# Patient Record
Sex: Male | Born: 1990
Health system: Southern US, Community
[De-identification: ages and names within clinical notes are randomized; demographics above are authoritative.]

## PROBLEM LIST (undated history)

## (undated) DIAGNOSIS — K219 Gastro-esophageal reflux disease without esophagitis: Secondary | ICD-10-CM

## (undated) HISTORY — DX: Gastro-esophageal reflux disease without esophagitis: K21.9

## (undated) HISTORY — PX: ELBOW SURGERY: SHX618

---
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
## 2013-08-19 ENCOUNTER — Emergency Department (HOSPITAL_BASED_OUTPATIENT_CLINIC_OR_DEPARTMENT_OTHER): Payer: BC Managed Care – PPO

## 2013-08-19 ENCOUNTER — Encounter (HOSPITAL_BASED_OUTPATIENT_CLINIC_OR_DEPARTMENT_OTHER): Payer: Self-pay | Admitting: *Deleted

## 2013-08-19 ENCOUNTER — Emergency Department (HOSPITAL_BASED_OUTPATIENT_CLINIC_OR_DEPARTMENT_OTHER)
Admission: EM | Admit: 2013-08-19 | Discharge: 2013-08-19 | Disposition: A | Discharge status: Home / Self Care | Payer: BC Managed Care – PPO | Attending: Emergency Medicine | Admitting: Emergency Medicine

## 2013-08-19 DIAGNOSIS — R21 Rash and other nonspecific skin eruption: Secondary | ICD-10-CM | POA: Insufficient documentation

## 2013-08-19 DIAGNOSIS — S60112A Contusion of left thumb with damage to nail, initial encounter: Secondary | ICD-10-CM

## 2013-08-19 DIAGNOSIS — S6000XA Contusion of unspecified finger without damage to nail, initial encounter: Secondary | ICD-10-CM | POA: Insufficient documentation

## 2013-08-19 DIAGNOSIS — W230XXA Caught, crushed, jammed, or pinched between moving objects, initial encounter: Secondary | ICD-10-CM | POA: Insufficient documentation

## 2013-08-19 DIAGNOSIS — Y9289 Other specified places as the place of occurrence of the external cause: Secondary | ICD-10-CM | POA: Insufficient documentation

## 2013-08-19 DIAGNOSIS — T148XXA Other injury of unspecified body region, initial encounter: Secondary | ICD-10-CM

## 2013-08-19 DIAGNOSIS — Y990 Civilian activity done for income or pay: Secondary | ICD-10-CM | POA: Insufficient documentation

## 2013-08-19 DIAGNOSIS — Y9389 Activity, other specified: Secondary | ICD-10-CM | POA: Insufficient documentation

## 2013-08-19 MED ORDER — IBUPROFEN 600 MG PO TABS: 600.0000 mg | ORAL_TABLET | Freq: Four times a day (QID) | ORAL | Status: DC | PRN

## 2013-08-19 MED ORDER — HYDROCODONE-ACETAMINOPHEN 5-325 MG PO TABS: 5.0000 | ORAL_TABLET | Freq: Four times a day (QID) | ORAL | Status: DC | PRN

## 2013-08-19 MED ORDER — ACETAMINOPHEN 325 MG PO TABS
650.0000 mg | ORAL_TABLET | Freq: Once | ORAL | Status: AC
  Administered 2013-08-19: 650 mg via ORAL
  Filled 2013-08-19: qty 2

## 2013-08-19 MED ORDER — HYDROCODONE-ACETAMINOPHEN 5-325 MG PO TABS: 1.0000 | ORAL_TABLET | Freq: Four times a day (QID) | ORAL | Status: DC | PRN

## 2013-08-19 NOTE — ED Provider Notes (Addendum)
CSN: [id number]     Arrival date & time 08/19/13  0844 History   First MD Initiated Contact with Patient 08/19/13 [phone]     Chief Complaint  Patient presents with  . Hand Pain   (Consider location/radiation/quality/duration/timing/severity/associated sxs/prior Treatment) HPI Comments: Pt comes in with cc of thumb pain. Pt has no medical problems, works as a Systems analyst, and jammed his left thumb between [employer] y'day. He states that the injry occurred at 4, and overtime, his pain has increased significantly. The pain is throbbing, constant, severe.  Patient is a 22 y.o. male presenting with hand pain. The history is provided by the patient.  Hand Pain    History reviewed. No pertinent past medical history. History reviewed. No pertinent past surgical history. History reviewed. No pertinent family history. History  Substance Use Topics  . Smoking status: Never Smoker   . Smokeless tobacco: Not on file  . Alcohol Use: No    Review of Systems  Constitutional: Positive for activity change.  Musculoskeletal: Positive for arthralgias.  Skin: Positive for rash.  Hematological: Does not bruise/bleed easily.    Allergies  Review of patient's allergies indicates no known allergies.  Home Medications   Current Outpatient Rx  Name  Route  Sig  Dispense  Refill  . HYDROcodone-acetaminophen (NORCO/VICODIN) 5-325 MG per tablet   Oral   Take 5 tablets by mouth every 6 (six) hours as needed for pain.   15 tablet   0   . ibuprofen (ADVIL,MOTRIN) 600 MG tablet   Oral   Take 1 tablet (600 mg total) by mouth every 6 (six) hours as needed for pain.   30 tablet   0    BP 163/79  Pulse 60  Temp(Src) 98.1 F (36.7 C) (Oral)  Resp 16  Ht 5\' 11"  (1.803 m)  Wt 200 lb (90.719 kg)  BMI 27.91 kg/m2  SpO2 99% Physical Exam  Nursing note and vitals reviewed. Constitutional: He appears well-developed.  HENT:  Head: Normocephalic.  Eyes: Conjunctivae are normal.  Neck: Neck  supple.  Pulmonary/Chest: Effort normal.  Musculoskeletal:  Left thumb is slightly swollen, with subungal hematoma. Tenderness mostly distal to the IP joint    ED Course  Procedures (including critical care time) Labs Review Labs Reviewed - No data to display Imaging Review Dg Finger Thumb Right  08/19/2013   *RADIOLOGY REPORT*  Clinical Data: Pain post trauma  RIGHT THUMB 2+V  Comparison: None.  Findings: Frontal, oblique, and lateral views were obtained.  There is no fracture or dislocation.  Joint spaces appear intact.  No erosive change.  IMPRESSION: No abnormality noted.   Original Report Authenticated By: Bretta Bang, M.D.    MDM   1. Contusion   2. Hematoma, subungual, thumb, left, initial encounter      Subungal Hematoma Drainage: Date/Time: 08/19/2013 10:15 AM Performed by: Derwood Kaplan Authorized by: Derwood Kaplan Consent: Verbal consent obtained. Equipment used: Cauterizing nail trephinator Risks and benefits: risks, benefits and alternatives were discussed Consent given by: patient Patient identity confirmed: verbally with patient Local anesthesia used: no Patient sedated: no Patient tolerance: Patient tolerated the procedure well with no immediate complications.  Pt comes in with thumb injury. Xrays is normal. Will provide thumb spica. Likely contusion.  Pt also had subungal hematoma - likely cause of the burning type pain. Hematoma drained. DC with Sports f/u.   Derwood Kaplan, MD 08/19/13 1017  Derwood Kaplan, MD 09/04/13 [phone]

## 2013-08-19 NOTE — ED Notes (Signed)
Jammed right thumb under a barbell last pm at 1800 nail bed blue and purple joint below nail is swollen and painful.

## 2013-08-19 NOTE — Discharge Instructions (Signed)
Return to the Er if the symptoms are getting worse. Otherwise, ice aggressively, take the pain meds prescribed.  See the doctor as requested.  RICE: Routine Care for Injuries The routine care of many injuries includes Rest, Ice, Compression, and Elevation (RICE). HOME CARE INSTRUCTIONS  Rest is needed to allow your body to heal. Routine activities can usually be resumed when comfortable. Injured tendons and bones can take up to 6 weeks to heal. Tendons are the cord-like structures that attach muscle to bone.  Ice following an injury helps keep the swelling down and reduces pain.  Put ice in a plastic bag.  Place a towel between your skin and the bag.  Leave the ice on for 15-20 minutes, 3-4 times a day. Do this while awake, for the first 24 to 48 hours. After that, continue as directed by your caregiver.  Compression helps keep swelling down. It also gives support and helps with discomfort. If an elastic bandage has been applied, it should be removed and reapplied every 3 to 4 hours. It should not be applied tightly, but firmly enough to keep swelling down. Watch fingers or toes for swelling, bluish discoloration, coldness, numbness, or excessive pain. If any of these problems occur, remove the bandage and reapply loosely. Contact your caregiver if these problems continue.  Elevation helps reduce swelling and decreases pain. With extremities, such as the arms, hands, legs, and feet, the injured area should be placed near or above the level of the heart, if possible. SEEK IMMEDIATE MEDICAL CARE IF:  You have persistent pain and swelling.  You develop redness, numbness, or unexpected weakness.  Your symptoms are getting worse rather than improving after several days. These symptoms may indicate that further evaluation or further X-rays are needed. Sometimes, X-rays may not show a small broken bone (fracture) until 1 week or 10 days later. Make a follow-up appointment with your caregiver.  Ask when your X-ray results will be ready. Make sure you get your X-ray results. Document Released: 03/16/2001 Document Revised: 02/24/2012 Document Reviewed: 05/03/2011 [facility] Patient Information 2014 [place], [place].   Contusion A contusion is a deep bruise. Contusions are the result of an injury that caused bleeding under the skin. The contusion may turn blue, purple, or yellow. Minor injuries will give you a painless contusion, but more severe contusions may stay painful and swollen for a few weeks.  CAUSES  A contusion is usually caused by a blow, trauma, or direct force to an area of the body. SYMPTOMS   Swelling and redness of the injured area.  Bruising of the injured area.  Tenderness and soreness of the injured area.  Pain. DIAGNOSIS  The diagnosis can be made by taking a history and physical exam. An X-ray, CT scan, or MRI may be needed to determine if there were any associated injuries, such as fractures. TREATMENT  Specific treatment will depend on what area of the body was injured. In general, the best treatment for a contusion is resting, icing, elevating, and applying cold compresses to the injured area. Over-the-counter medicines may also be recommended for pain control. Ask your caregiver what the best treatment is for your contusion. HOME CARE INSTRUCTIONS   Put ice on the injured area.  Put ice in a plastic bag.  Place a towel between your skin and the bag.  Leave the ice on for 15-20 minutes, 3-4 times a day.  Only take over-the-counter or prescription medicines for pain, discomfort, or fever as directed by your caregiver.  Your caregiver may recommend avoiding anti-inflammatory medicines (aspirin, ibuprofen, and naproxen) for 48 hours because these medicines may increase bruising.  Rest the injured area.  If possible, elevate the injured area to reduce swelling. SEEK IMMEDIATE MEDICAL CARE IF:   You have increased bruising or swelling.  You have pain  that is getting worse.  Your swelling or pain is not relieved with medicines. MAKE SURE YOU:   Understand these instructions.  Will watch your condition.  Will get help right away if you are not doing well or get worse. Document Released: 09/11/2005 Document Revised: 02/24/2012 Document Reviewed: 10/07/2011 [facility] Patient Information 2014 [place], [place].  Nail Bed Injury The nail bed is the soft tissue under the nail. This tissue includes the growth center of the nail. If this growth center is damaged, the nail may not grow back normally. It can take several months for an injured or torn off (avulsed) fingernail or toenail to regrow. The regrown nail might have an abnormal shape or appearance.  DIAGNOSIS   Tell your caregiver exactly how your injury occurred.  Tell your caregiver if you think there is a splinter (wood, metal, glass) in your finger or toe or under the nail.  Tell your caregiver about any other medical problems you have (especially diabetes or peripheral vascular disease). Tell them also about any medications you take.  In addition to examining your injury, your caregiver may need to check for diabetes, nerve problems or poor circulation.  Nail bed injuries can involve the bone in the tip of your finger or toe. X-rays may be needed to see if you have a fracture. TREATMENT  Your nail bed injury may be treated in several different ways.  You may not require any special treatment other than keeping the area clean and free of infection.  You may require removal of a blood clot under the nail. This can often be a very simple procedure.  You may need part of your nail removed. This might be necessary to suture any cut (laceration) in the nail bed. Sometimes the avulsed nail is stitched back in place to provide temporary protection to the nail bed until the new nail grows in.  For certain injuries, your caregiver may direct you to see a hand specialist. HOME CARE  INSTRUCTIONS   Keep your hand or foot elevated to relieve pain and swelling.  For your foot, this will require lying in bed or on a couch with the leg on pillows or sitting in a recliner with the leg up. Walking or letting your leg dangle may increase swelling, slow healing and cause throbbing pain.  For your hand, this will require elevating your hand above the level of your heart. Use pillows on a table or the arm of your chair while sitting, and on your bed while sleeping.  Keep your injury protected with bandages or splints as recommended.  Keep your bandage dry and clean. Change your bandage as directed by your caregiver.  Only take over-the-counter or prescription medicines for pain, discomfort or fever as directed by your caregiver.  See your caregiver as needed for problems. SEEK MEDICAL CARE IF:   You have pain that is not controlled by your medication.  You have any problems caring for your injury. SEEK IMMEDIATE MEDICAL CARE IF:   You have increased pain, swelling, inflammation (redness & warmth), drainage or bleeding.  An oral temperature above 102 F (38.9 C) develops, not controlled by medication.  You have swelling which spreads  from your finger into your hand, or your toe into your foot. Document Released: 01/09/2005 Document Revised: 02/24/2012 Document Reviewed: 12/06/2008 [facility] Patient Information 2014 [place], [place].

## 2018-02-27 ENCOUNTER — Emergency Department (HOSPITAL_BASED_OUTPATIENT_CLINIC_OR_DEPARTMENT_OTHER)
Admission: EM | Admit: 2018-02-27 | Discharge: 2018-02-27 | Disposition: A | Discharge status: Home / Self Care | Payer: PRIVATE HEALTH INSURANCE | Attending: Emergency Medicine | Admitting: Emergency Medicine

## 2018-02-27 ENCOUNTER — Other Ambulatory Visit: Payer: Self-pay

## 2018-02-27 ENCOUNTER — Encounter (HOSPITAL_BASED_OUTPATIENT_CLINIC_OR_DEPARTMENT_OTHER): Payer: Self-pay | Admitting: Emergency Medicine

## 2018-02-27 DIAGNOSIS — Y999 Unspecified external cause status: Secondary | ICD-10-CM | POA: Insufficient documentation

## 2018-02-27 DIAGNOSIS — Y93G1 Activity, food preparation and clean up: Secondary | ICD-10-CM | POA: Diagnosis not present

## 2018-02-27 DIAGNOSIS — Y929 Unspecified place or not applicable: Secondary | ICD-10-CM | POA: Insufficient documentation

## 2018-02-27 DIAGNOSIS — S6992XA Unspecified injury of left wrist, hand and finger(s), initial encounter: Secondary | ICD-10-CM | POA: Diagnosis present

## 2018-02-27 DIAGNOSIS — W260XXA Contact with knife, initial encounter: Secondary | ICD-10-CM | POA: Diagnosis not present

## 2018-02-27 DIAGNOSIS — S61012A Laceration without foreign body of left thumb without damage to nail, initial encounter: Secondary | ICD-10-CM | POA: Insufficient documentation

## 2018-02-27 DIAGNOSIS — Z23 Encounter for immunization: Secondary | ICD-10-CM | POA: Diagnosis not present

## 2018-02-27 MED ORDER — TETANUS-DIPHTH-ACELL PERTUSSIS 5-2.5-18.5 LF-MCG/0.5 IM SUSP
0.5000 mL | Freq: Once | INTRAMUSCULAR | Status: AC
  Administered 2018-02-27: 0.5 mL via INTRAMUSCULAR
  Filled 2018-02-27: qty 0.5

## 2018-02-27 MED ORDER — CEPHALEXIN 250 MG PO CAPS
500.0000 mg | ORAL_CAPSULE | Freq: Once | ORAL | Status: AC
  Administered 2018-02-27: 500 mg via ORAL
  Filled 2018-02-27: qty 2

## 2018-02-27 MED ORDER — CEPHALEXIN 500 MG PO CAPS: 500.0000 mg | ORAL_CAPSULE | Freq: Four times a day (QID) | ORAL | 0 refills | Status: AC

## 2018-02-27 MED FILL — CEPHALEXIN 500 MG CAPSULE: 500 | 5 days supply | Qty: 20 | Fill #0

## 2018-02-27 NOTE — ED Notes (Signed)
ED Provider at bedside. 

## 2018-02-27 NOTE — ED Triage Notes (Signed)
Patient states that he cut the tip of his left thumb about 30 - 40 minutes ago

## 2018-02-27 NOTE — ED Provider Notes (Signed)
[facility] EMERGENCY DEPARTMENT Provider Note   CSN: [id number] Arrival date & time: 02/27/18  1149     History   Chief Complaint Chief Complaint  Patient presents with  . Extremity Laceration    HPI Isaac Bailey is a 27 y.o. male.  27 year old male presents to the emergency department for evaluation of injury to the tip of his left thumb.  Patient states that he was cutting meat when he accidentally sliced the tip of his left first digit.  Incident occurred 30-40 minutes ago.  He applied pressure after the injury which has largely controlled the bleeding.  Patient does note a mild, constant pain which is aggravated with palpation to the area. He denies any sensation changes or range of motion limitations to the affected digit.  He cannot recall the date of his last tetanus shot. No medications taken PTA for pain.    The history is provided by the patient. No language interpreter was used.    History reviewed. No pertinent past medical history.  There are no active problems to display for this patient.   History reviewed. No pertinent surgical history.     Home Medications    Prior to Admission medications   Medication Sig Start Date End Date Taking? Authorizing Provider  cephALEXin (KEFLEX) 500 MG capsule Take 1 capsule (500 mg total) by mouth 4 (four) times daily for 5 days. 02/27/18 03/04/18  Antony MaduraHumes, Keilany Burnette, PA-C  HYDROcodone-acetaminophen (NORCO/VICODIN) 5-325 MG per tablet Take 1 tablet by mouth every 6 (six) hours as needed for pain. 08/19/13   Derwood KaplanNanavati, Ankit, MD  ibuprofen (ADVIL,MOTRIN) 600 MG tablet Take 1 tablet (600 mg total) by mouth every 6 (six) hours as needed for pain. 08/19/13   Derwood KaplanNanavati, Ankit, MD    Family History History reviewed. No pertinent family history.  Social History Social History   Tobacco Use  . Smoking status: Never Smoker  . Smokeless tobacco: Never Used  Substance Use Topics  . Alcohol use: No  . Drug use: No      Allergies   Patient has no known allergies.   Review of Systems Review of Systems Ten systems reviewed and are negative for acute change, except as noted in the HPI.    Physical Exam Updated Vital Signs BP (!) 145/87 (BP Location: Right Arm)   Pulse 84   Temp 98 F (36.7 C) (Oral)   Resp 20   Ht 5\' 11"  (1.803 m)   Wt 90.7 kg (200 lb)   SpO2 98%   BMI 27.89 kg/m   Physical Exam  Constitutional: He is oriented to person, place, and time. He appears well-developed and well-nourished. No distress.  Nontoxic appearing and in NAD  HENT:  Head: Normocephalic and atraumatic.  Eyes: Conjunctivae and EOM are normal. No scleral icterus.  Neck: Normal range of motion.  Cardiovascular: Normal rate, regular rhythm and intact distal pulses.  Capillary refill brisk to the tip of the affected digit.  Pulmonary/Chest: Effort normal. No respiratory distress.  Respirations even and unlabored  Musculoskeletal: Normal range of motion.  Near avulsion to the tip of the distal L thumb. No active bleeding.   Neurological: He is alert and oriented to person, place, and time.  Sensation intact to the distal tip of the L thumb. Normal ROM of the affected digit.  Skin: Skin is warm and dry. No rash noted. He is not diaphoretic. No erythema. No pallor.  Psychiatric: He has a normal mood and affect. His behavior  is normal.  Nursing note and vitals reviewed.    ED Treatments / Results  Labs (all labs ordered are listed, but only abnormal results are displayed) Labs Reviewed - No data to display  EKG  EKG Interpretation None       Radiology No results found.  Procedures Procedures (including critical care time)  LACERATION REPAIR Performed by: Antony Madura Authorized by: Antony Madura Consent: Verbal consent obtained. Risks and benefits: risks, benefits and alternatives were discussed Consent given by: patient Patient identity confirmed: provided demographic data Prepped and  Draped in normal sterile fashion Wound explored  Laceration Location: L thumb  Laceration Length: 1cm  No Foreign Bodies seen or palpated  Irrigation method: syringe Amount of cleaning: standard  Skin closure: Dermabond  Number of sutures: n/a  Technique: simple  Patient tolerance: Patient tolerated the procedure well with no immediate complications.   Medications Ordered in ED Medications  Tdap (BOOSTRIX) injection 0.5 mL (not administered)  cephALEXin (KEFLEX) capsule 500 mg (not administered)     Initial Impression / Assessment and Plan / ED Course  I have reviewed the triage vital signs and the nursing notes.  Pertinent labs & imaging results that were available during my care of the patient were reviewed by me and considered in my medical decision making (see chart for details).     Tdap booster given. Laceration occurred < 8 hours prior to repair which was well tolerated. Pt has no comorbidities to effect normal wound healing. Discussed wound home care with patient and answered questions. Pt to follow up for wound recheck PRN. Pt is hemodynamically stable with no complaints prior to discharge.     Final Clinical Impressions(s) / ED Diagnoses   Final diagnoses:  Laceration of left thumb without foreign body, nail damage status unspecified, initial encounter    ED Discharge Orders        Ordered    cephALEXin (KEFLEX) 500 MG capsule  4 times daily     02/27/18 1210       Antony Madura, PA-C 02/27/18 1217    Terrilee Files, MD 02/28/18 1940

## 2018-02-27 NOTE — Discharge Instructions (Signed)
Take Keflex as prescribed until finished. You may take tylenol or ibuprofen for pain. Keep the area covered to prevent repeat injury. Return to the ED if symptoms worsen or signs of infection develop.

## 2019-03-18 DIAGNOSIS — S8012XA Contusion of left lower leg, initial encounter: Secondary | ICD-10-CM | POA: Diagnosis not present

## 2019-03-18 DIAGNOSIS — S91002A Unspecified open wound, left ankle, initial encounter: Secondary | ICD-10-CM | POA: Diagnosis not present

## 2019-03-22 ENCOUNTER — Other Ambulatory Visit: Payer: Self-pay

## 2019-03-22 ENCOUNTER — Ambulatory Visit (INDEPENDENT_AMBULATORY_CARE_PROVIDER_SITE_OTHER): Payer: BLUE CROSS/BLUE SHIELD | Admitting: Family Medicine

## 2019-03-22 ENCOUNTER — Encounter: Payer: Self-pay | Admitting: Family Medicine

## 2019-03-22 VITALS — BP 125/86 | HR 67 | Temp 97.9°F | Ht 71.0 in | Wt 200.0 lb

## 2019-03-22 DIAGNOSIS — S8992XA Unspecified injury of left lower leg, initial encounter: Secondary | ICD-10-CM

## 2019-03-22 DIAGNOSIS — M25512 Pain in left shoulder: Secondary | ICD-10-CM | POA: Diagnosis not present

## 2019-03-22 DIAGNOSIS — G8929 Other chronic pain: Secondary | ICD-10-CM | POA: Diagnosis not present

## 2019-03-22 NOTE — Patient Instructions (Signed)
You have rotator cuff impingement Try to avoid painful activities (overhead activities, lifting with extended arm) as much as possible. Ibuprofen OR diclofenac with food as needed for pain and inflammation. Can take tylenol in addition to this. Consider physical therapy with transition to home exercise program. Do home exercise program with theraband and scapular stabilization exercises daily 3 sets of 10 once a day. If not improving at follow-up we will consider further imaging, injection, physical therapy, and/or nitro patches. Let me know how you're doing over the next 4-6 weeks - would consider adding nitro patches as next step if not improving as expected.  Your lower leg injury is a contusion with hematoma but improving as expected. Elevate above your heart when possible. Icing 15 minutes at a time 3-4 times a day. Consider ACE wrap for compression for swelling. Follow up as needed - expect this to feel back to normal within 2 weeks.
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
## 2019-09-14 ENCOUNTER — Telehealth: Payer: Self-pay

## 2019-09-14 ENCOUNTER — Encounter (HOSPITAL_BASED_OUTPATIENT_CLINIC_OR_DEPARTMENT_OTHER): Payer: Self-pay | Admitting: Emergency Medicine

## 2019-09-14 ENCOUNTER — Other Ambulatory Visit: Payer: Self-pay

## 2019-09-14 ENCOUNTER — Emergency Department (HOSPITAL_BASED_OUTPATIENT_CLINIC_OR_DEPARTMENT_OTHER)
Admission: EM | Admit: 2019-09-14 | Discharge: 2019-09-14 | Disposition: A | Discharge status: Home / Self Care | Payer: PRIVATE HEALTH INSURANCE | Attending: Emergency Medicine | Admitting: Emergency Medicine

## 2019-09-14 DIAGNOSIS — R309 Painful micturition, unspecified: Secondary | ICD-10-CM | POA: Insufficient documentation

## 2019-09-14 DIAGNOSIS — Z202 Contact with and (suspected) exposure to infections with a predominantly sexual mode of transmission: Secondary | ICD-10-CM | POA: Insufficient documentation

## 2019-09-14 LAB — URINALYSIS, ROUTINE W REFLEX MICROSCOPIC
Bilirubin Urine: NEGATIVE
Glucose, UA: NEGATIVE mg/dL
Hgb urine dipstick: NEGATIVE
Ketones, ur: NEGATIVE mg/dL
Leukocytes,Ua: NEGATIVE
Nitrite: NEGATIVE
Protein, ur: NEGATIVE mg/dL
Specific Gravity, Urine: 1.02 (ref 1.005–1.030)
pH: 6.5 (ref 5.0–8.0)

## 2019-09-14 MED ORDER — AZITHROMYCIN 1 G PO PACK
1.0000 g | PACK | Freq: Once | ORAL | Status: AC
  Administered 2019-09-14: 14:00:00 1 g via ORAL
  Filled 2019-09-14: qty 1

## 2019-09-14 MED ORDER — CEFTRIAXONE SODIUM 250 MG IJ SOLR
250.0000 mg | Freq: Once | INTRAMUSCULAR | Status: AC
  Administered 2019-09-14: 14:00:00 250 mg via INTRAMUSCULAR
  Filled 2019-09-14: qty 250

## 2019-09-14 NOTE — Telephone Encounter (Signed)
Copied from [place] [id number]. Topic: General - Other >> Sep 13, 2019  3:19 PM Yvette Rack wrote: Reason for CRM: Patient stated [name] is his mother, father, and brother's pcp and he was told that she would accept him as a new patient. Patient requests a call back to advise if [name] will accept him as a new patient. Cb# [phone] '    Kristie Can you let patient know that [name] not accepting new patients at this time. His brother see's Isaac Bailey, he may want to schedule with Isaac Bailey to get established or [name]

## 2019-09-14 NOTE — Discharge Instructions (Addendum)
We are testing you today for gonorrhea and chlamydia.  You have also been treated for both of these.  Please make sure you use protection and refrain from intercourse until you know your test results.  If your results are positive you should follow-up with health department in 2 weeks for retesting to make sure the medication has cured the infection.  You may also go to the health department for other testing such as hepatitis, HIV, syphilis, herpes.

## 2019-09-14 NOTE — Telephone Encounter (Signed)
appt sch for 10/20 with Dr. Nani Ravens

## 2019-09-14 NOTE — ED Provider Notes (Signed)
[facility] EMERGENCY DEPARTMENT Provider Note   CSN: [id number] Arrival date & time: 09/14/19  1037     History   Chief Complaint Chief Complaint  Patient presents with  . Exposure to STD    HPI Isaac Bailey is a 28 y.o. male.     Patient is a 28 year old male with no past medical history presenting to the emergency department for STD exposure.  He reports that this past week his male partner noted that she was having burning with urination and she might have STD.  She was tested but does not know results yet.  Patient is not having any current symptoms but would like to be treated for gonorrhea and chlamydia as he has a high suspicion that he is contracted this from his girlfriend.     History reviewed. No pertinent past medical history.  There are no active problems to display for this patient.   History reviewed. No pertinent surgical history.      Home Medications    Prior to Admission medications   Medication Sig Start Date End Date Taking? Authorizing Provider  HYDROcodone-acetaminophen (NORCO/VICODIN) 5-325 MG per tablet Take 1 tablet by mouth every 6 (six) hours as needed for pain. 08/19/13   Varney Biles, MD  ibuprofen (ADVIL,MOTRIN) 600 MG tablet Take 1 tablet (600 mg total) by mouth every 6 (six) hours as needed for pain. 08/19/13   Varney Biles, MD    Family History History reviewed. No pertinent family history.  Social History Social History   Tobacco Use  . Smoking status: Never Smoker  . Smokeless tobacco: Never Used  Substance Use Topics  . Alcohol use: No  . Drug use: No     Allergies   Patient has no known allergies.   Review of Systems Review of Systems  Constitutional: Negative for chills and fever.  Gastrointestinal: Negative for abdominal pain, nausea and vomiting.  Genitourinary: Negative for decreased urine volume, discharge, dysuria, flank pain, frequency, genital sores, hematuria, penile pain, penile  swelling, scrotal swelling, testicular pain and urgency.  Musculoskeletal: Negative for arthralgias and joint swelling.  All other systems reviewed and are negative.    Physical Exam Updated Vital Signs BP 133/86   Pulse 66   Temp 98.3 F (36.8 C) (Oral)   Resp 16   Ht 5\' 11"  (1.803 m)   Wt 92.5 kg   BMI 28.45 kg/m   Physical Exam Vitals signs and nursing note reviewed.  Constitutional:      Appearance: Normal appearance.  HENT:     Head: Normocephalic.  Eyes:     Conjunctiva/sclera: Conjunctivae normal.  Pulmonary:     Effort: Pulmonary effort is normal.  Skin:    General: Skin is dry.  Neurological:     Mental Status: He is alert.  Psychiatric:        Mood and Affect: Mood normal.      ED Treatments / Results  Labs (all labs ordered are listed, but only abnormal results are displayed) Labs Reviewed  URINALYSIS, ROUTINE W REFLEX MICROSCOPIC  GC/CHLAMYDIA PROBE AMP ([place]) NOT AT [facility]    EKG None  Radiology No results found.  Procedures Procedures (including critical care time)  Medications Ordered in ED Medications  cefTRIAXone (ROCEPHIN) injection 250 mg (has no administration in time range)  azithromycin (ZITHROMAX) powder 1 g (has no administration in time range)     Initial Impression / Assessment and Plan / ED Course  I have reviewed the triage  vital signs and the nursing notes.  Pertinent labs & imaging results that were available during my care of the patient were reviewed by me and considered in my medical decision making (see chart for details).        Based on review of vitals, medical screening exam, lab work and/or imaging, there does not appear to be an acute, emergent etiology for the patient's symptoms. Counseled pt on good return precautions and encouraged both PCP and ED follow-up as needed.  Prior to discharge, I also discussed incidental imaging findings with patient in detail and advised appropriate, recommended  follow-up in detail.  Clinical Impression: 1. STD exposure     Disposition: Discharge  Prior to providing a prescription for a controlled substance, I independently reviewed the patient's recent prescription history on the [place] Controlled Substance Reporting System. The patient had no recent or regular prescriptions and was deemed appropriate for a brief, less than 3 day prescription of narcotic for acute analgesia.  This note was prepared with assistance of Conservation officer, historic buildings. Occasional wrong-word or sound-a-like substitutions may have occurred due to the inherent limitations of voice recognition software.   Final Clinical Impressions(s) / ED Diagnoses   Final diagnoses:  STD exposure    ED Discharge Orders    None       Jeral Pinch 09/14/19 1350    Tegeler, Canary Brim, MD 09/14/19 1527

## 2019-09-14 NOTE — ED Triage Notes (Signed)
Pt believes that hes been exposed to STD.  Denies symptoms but believes that his girlfriend has symptoms.

## 2019-09-15 LAB — GC/CHLAMYDIA PROBE AMP (~~LOC~~) NOT AT ARMC
Chlamydia: NEGATIVE
Molecular Disclaimer: NEGATIVE
Molecular Disclaimer: NORMAL
Neisseria Gonorrhea: NEGATIVE

## 2019-09-20 ENCOUNTER — Encounter (HOSPITAL_BASED_OUTPATIENT_CLINIC_OR_DEPARTMENT_OTHER): Payer: Self-pay | Admitting: Emergency Medicine

## 2019-10-05 ENCOUNTER — Other Ambulatory Visit: Payer: Self-pay

## 2019-10-05 ENCOUNTER — Other Ambulatory Visit (HOSPITAL_COMMUNITY)
Admission: RE | Admit: 2019-10-05 | Discharge: 2019-10-05 | Disposition: A | Discharge status: Home / Self Care | Payer: PRIVATE HEALTH INSURANCE | Source: Ambulatory Visit | Attending: Family Medicine | Admitting: Family Medicine

## 2019-10-05 ENCOUNTER — Ambulatory Visit: Payer: PRIVATE HEALTH INSURANCE | Admitting: Family Medicine

## 2019-10-05 ENCOUNTER — Encounter: Payer: Self-pay | Admitting: Family Medicine

## 2019-10-05 VITALS — BP 122/74 | HR 63 | Temp 96.1°F | Ht 71.0 in | Wt 217.4 lb

## 2019-10-05 DIAGNOSIS — Z114 Encounter for screening for human immunodeficiency virus [HIV]: Secondary | ICD-10-CM | POA: Diagnosis not present

## 2019-10-05 DIAGNOSIS — K219 Gastro-esophageal reflux disease without esophagitis: Secondary | ICD-10-CM

## 2019-10-05 DIAGNOSIS — F902 Attention-deficit hyperactivity disorder, combined type: Secondary | ICD-10-CM

## 2019-10-05 DIAGNOSIS — Z113 Encounter for screening for infections with a predominantly sexual mode of transmission: Secondary | ICD-10-CM | POA: Insufficient documentation

## 2019-10-05 DIAGNOSIS — Z0000 Encounter for general adult medical examination without abnormal findings: Secondary | ICD-10-CM

## 2019-10-05 MED ORDER — AMPHETAMINE-DEXTROAMPHETAMINE 20 MG PO TABS: 20.0000 mg | ORAL_TABLET | Freq: Every day | ORAL | 0 refills | Status: DC

## 2019-10-05 MED ORDER — PANTOPRAZOLE SODIUM 40 MG PO TBEC: 40.0000 mg | DELAYED_RELEASE_TABLET | Freq: Every day | ORAL | 1 refills | Status: DC

## 2019-10-05 NOTE — Addendum Note (Signed)
Addended by: Kelle Darting A on: 10/05/2019 03:23 PM   Modules accepted: Orders

## 2019-10-05 NOTE — Patient Instructions (Addendum)
Give [place] 2-3 business days to get the results of your labs back.   OK to use Debrox (peroxide) in the ear to loosen up wax. Also recommend using a bulb syringe (for removing boogers from baby's noses) to flush through warm water. Do not use Q-tips as this can impact wax further.  Keep the diet clean and stay active.  Do monthly self testicular checks in the shower. You are feeling for lumps/bumps that don't belong. If you feel anything like this, let me know!  The only lifestyle changes that have data behind them are weight loss for the overweight/obese and elevating the head of the bed. Finding out which foods/positions are triggers is important.  Let us know if you need anything.

## 2019-10-05 NOTE — Progress Notes (Signed)
Chief Complaint  Patient presents with  . New Patient (Initial Visit)    problems staying focused  . Gastroesophageal Reflux    [name] is here for a complete physical.   His last physical was >1 year ago.  Current diet: in general, a "healthy" diet.   Current exercise: cardio, wt resistance Weight trend: stable Daytime fatigue? No. Seat belt? Yes.    Health maintenance Tetanus- Yes HIV- No   Patient has a history of ADHD combined inattentive and hyperactive.  He was on Adderall as a child.  He did not like the long-acting version nor the way it made him feel so he stopped after he turned 19.  He started when he was around 28 years old.  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  He has difficulty concentrating for extended periods of time.  He would like to start another medicine to help with this issue.  Patient has a history of reflux.  This is been going on for many years, most of his adult life.  He has burning and abdominal pain, particularly after eating spicy foods.  He has taken Pepcid with some relief but feels he needs something stronger.  He does have a family history of reflux.  No unintended weight loss, vomiting, bleeding, or stool changes.  He has never had a scope.  Patient is sexually active, does not always use protection.  He would like to be tested for STIs.  No concerns from his partners.  He has no skin lesions, discharge, or urinary complaints otherwise.  Past Medical History:  Diagnosis Date  . GERD (gastroesophageal reflux disease)      Past Surgical History:  Procedure Laterality Date  . ELBOW SURGERY Right     Medications  Takes no meds routinely.  Allergies No Known Allergies  Family History History reviewed. No pertinent family history.  Review of Systems: Constitutional: no fevers or chills Eye:  no recent significant change in vision Ear/Nose/Mouth/Throat:  Ears:  no hearing  loss Nose/Mouth/Throat:  no complaints of nasal congestion, no sore throat Cardiovascular:  no chest pain Respiratory:  no shortness of breath Gastrointestinal:  no abdominal pain, no change in bowel habits GU:  Male: negative for dysuria, frequency, and incontinence Musculoskeletal/Extremities:  no pain of the joints Integumentary (Skin/Breast):  no abnormal skin lesions reported Neurologic:  no headaches Endocrine: No unexpected weight changes Hematologic/Lymphatic:  no night sweats  Exam BP 122/74 (BP Location: Left Arm, Patient Position: Sitting, Cuff Size: Normal)   Pulse 63   Temp (!) 96.1 F (35.6 C) (Temporal)   Ht 5\' 11"  (1.803 m)   Wt 217 lb 6 oz (98.6 kg)   SpO2 97%   BMI 30.32 kg/m  General:  well developed, well nourished, in no apparent distress Skin:  no significant moles, warts, or growths Head:  no masses, lesions, or tenderness Eyes:  pupils equal and round, sclera anicteric without injection Ears:  canals without lesions, TMs shiny without retraction, no obvious effusion, no erythema Nose:  nares patent, septum midline, mucosa normal Throat/Pharynx:  lips and gingiva without lesion; tongue and uvula midline; non-inflamed pharynx; no exudates or postnasal drainage Neck: neck supple without adenopathy, thyromegaly, or masses Lungs:  clear to auscultation, breath sounds equal bilaterally, no respiratory distress Cardio:  regular rate and rhythm, no bruits, no LE edema Abdomen:  abdomen soft, nontender; bowel sounds normal; no masses or organomegaly Genital (male): Uncircumcised penis, no lesions  or discharge; testes present bilaterally without masses or tenderness Rectal: Deferred Musculoskeletal:  symmetrical muscle groups noted without atrophy or deformity Extremities:  no clubbing, cyanosis, or edema, no deformities, no skin discoloration Neuro:  gait normal; deep tendon reflexes normal and symmetric Psych: well oriented with normal range of affect and  appropriate judgment/insight  Assessment and Plan  Well adult exam - Plan: Lipid panel, CBC, Comprehensive metabolic panel  Attention deficit hyperactivity disorder (ADHD), combined type - Plan: amphetamine-dextroamphetamine (ADDERALL) 20 MG tablet  Gastroesophageal reflux disease without esophagitis - Plan: pantoprazole (PROTONIX) 40 MG tablet  Screening for HIV (human immunodeficiency virus) - Plan: HIV Antibody (routine testing w rflx)  Routine screening for STI (sexually transmitted infection) - Plan: Urine cytology ancillary only([place])   Well 28 y.o. male. Counseled on diet and exercise. Self testicular exams recommended at least monthly.  Restart short acting Adderall to use daily.  I do not anticipate him using this daily.  I would like to see records ideally but they might not exist anymore.  If he does not have any benefit, could consider referral for neuropsych testing. Start Protonix twice daily for 2 weeks and then once daily.  Can use Pepcid as needed.  Reflux precautions discussed. Screen for STIs as noted above. Other orders as above. Follow up in 1 mo. The patient voiced understanding and agreement to the plan.  Jilda Roche [place], DO 10/05/19 3:09 PM

## 2019-10-06 LAB — CBC
HCT: 44.6 % (ref 38.5–50.0)
Hemoglobin: 15.2 g/dL (ref 13.2–17.1)
MCH: 31.7 pg (ref 27.0–33.0)
MCHC: 34.1 g/dL (ref 32.0–36.0)
MCV: 92.9 fL (ref 80.0–100.0)
MPV: 11.5 fL (ref 7.5–12.5)
Platelets: 321 10*3/uL (ref 140–400)
RBC: 4.8 10*6/uL (ref 4.20–5.80)
RDW: 11.9 % (ref 11.0–15.0)
WBC: 5.8 10*3/uL (ref 3.8–10.8)

## 2019-10-06 LAB — COMPREHENSIVE METABOLIC PANEL
AG Ratio: 2 (calc) (ref 1.0–2.5)
ALT: 26 U/L (ref 9–46)
AST: 31 U/L (ref 10–40)
Albumin: 4.7 g/dL (ref 3.6–5.1)
Alkaline phosphatase (APISO): 65 U/L (ref 36–130)
BUN: 14 mg/dL (ref 7–25)
CO2: 24 mmol/L (ref 20–32)
Calcium: 9.9 mg/dL (ref 8.6–10.3)
Chloride: 103 mmol/L (ref 98–110)
Creat: 1.14 mg/dL (ref 0.60–1.35)
Globulin: 2.4 g/dL (calc) (ref 1.9–3.7)
Glucose, Bld: 65 mg/dL (ref 65–99)
Potassium: 4.3 mmol/L (ref 3.5–5.3)
Sodium: 142 mmol/L (ref 135–146)
Total Bilirubin: 0.6 mg/dL (ref 0.2–1.2)
Total Protein: 7.1 g/dL (ref 6.1–8.1)

## 2019-10-06 LAB — LIPID PANEL
Cholesterol: 199 mg/dL (ref <200)
HDL: 49 mg/dL (ref >=40)
LDL Cholesterol (Calc): 124 mg/dL (calc) — ABNORMAL HIGH
Non-HDL Cholesterol (Calc): 150 mg/dL (calc) — ABNORMAL HIGH (ref <130)
Total CHOL/HDL Ratio: 4.1 (calc) (ref <5.0)
Triglycerides: 144 mg/dL (ref <150)

## 2019-10-06 LAB — HIV ANTIBODY (ROUTINE TESTING W REFLEX): HIV 1&2 Ab, 4th Generation: NONREACTIVE

## 2019-10-08 LAB — URINE CYTOLOGY ANCILLARY ONLY
Chlamydia: NEGATIVE
Comment: NEGATIVE
Comment: NEGATIVE
Comment: NORMAL
Neisseria Gonorrhea: NEGATIVE
Trichomonas: NEGATIVE

## 2019-11-19 ENCOUNTER — Encounter: Payer: Self-pay | Admitting: Family Medicine

## 2019-11-19 ENCOUNTER — Other Ambulatory Visit: Payer: Self-pay

## 2019-11-19 ENCOUNTER — Other Ambulatory Visit: Payer: Self-pay | Admitting: Family Medicine

## 2019-11-19 DIAGNOSIS — Z20822 Contact with and (suspected) exposure to covid-19: Secondary | ICD-10-CM

## 2019-11-19 DIAGNOSIS — F902 Attention-deficit hyperactivity disorder, combined type: Secondary | ICD-10-CM

## 2019-11-19 NOTE — Telephone Encounter (Signed)
Requesting: adderall 20mg  Last Visit:10/05/2019 Next Visit: Not scheduled Last Refill: 10/05/2019, #30 w/0RF  Please Advise

## 2019-11-20 LAB — NOVEL CORONAVIRUS, NAA: SARS-CoV-2, NAA: NOT DETECTED

## 2019-11-20 MED ORDER — AMPHETAMINE-DEXTROAMPHETAMINE 20 MG PO TABS: 20.0000 mg | ORAL_TABLET | Freq: Every day | ORAL | 0 refills | Status: DC

## 2019-11-20 NOTE — Telephone Encounter (Signed)
Refilled 1 mo. Needs to have his f/u scheduled, which was supposed to be at the end of Nov. Ty.

## 2019-11-24 ENCOUNTER — Other Ambulatory Visit: Payer: Self-pay

## 2019-11-24 ENCOUNTER — Ambulatory Visit (HOSPITAL_BASED_OUTPATIENT_CLINIC_OR_DEPARTMENT_OTHER)
Admission: RE | Admit: 2019-11-24 | Discharge: 2019-11-24 | Disposition: A | Discharge status: Home / Self Care | Payer: PRIVATE HEALTH INSURANCE | Source: Ambulatory Visit | Attending: Family Medicine | Admitting: Family Medicine

## 2019-11-24 ENCOUNTER — Ambulatory Visit: Payer: PRIVATE HEALTH INSURANCE | Admitting: Family Medicine

## 2019-11-24 ENCOUNTER — Encounter: Payer: Self-pay | Admitting: Family Medicine

## 2019-11-24 ENCOUNTER — Ambulatory Visit: Payer: Self-pay

## 2019-11-24 VITALS — BP 149/92 | HR 69 | Ht 71.0 in | Wt 210.0 lb

## 2019-11-24 DIAGNOSIS — S92351A Displaced fracture of fifth metatarsal bone, right foot, initial encounter for closed fracture: Secondary | ICD-10-CM | POA: Diagnosis not present

## 2019-11-24 DIAGNOSIS — M79671 Pain in right foot: Secondary | ICD-10-CM

## 2019-11-24 DIAGNOSIS — S92353A Displaced fracture of fifth metatarsal bone, unspecified foot, initial encounter for closed fracture: Secondary | ICD-10-CM | POA: Insufficient documentation

## 2019-11-24 NOTE — Progress Notes (Signed)
Isaac Bailey - 28 y.o. male MRN [id number]  Date of birth: [birth year]  SUBJECTIVE:  Including CC & ROS.  Chief Complaint  Patient presents with  . Ankle Injury    right ankle x 2 weeks    Isaac Bailey is a 28 y.o. male that is presenting with right foot pain.  He had an inversion injury about 2 weeks ago.  Since that time he has had ongoing pain over the lateral aspect of the midfoot.  He has had ongoing swelling as well.  He has a history of previous ankle injuries but this feels different.  He took 2 weeks off of work and started again this week.  The pain is been worse when he is ambulating or lifting.  He likes to lift on a regular basis and does different heavy weight.  The pain is localized to the lateral foot.  Pain can be severe and worse with weightbearing.  No numbness or tingling.    Review of Systems  Constitutional: Negative for fever.  HENT: Negative for congestion.   Respiratory: Negative for cough.   Cardiovascular: Negative for chest pain.  Gastrointestinal: Negative for abdominal pain.  Musculoskeletal: Negative for back pain.  Skin: Negative for color change.  Neurological: Negative for weakness.  Hematological: Negative for adenopathy.    HISTORY: Past Medical, Surgical, Social, and Family History Reviewed & Updated per EMR.   Pertinent Historical Findings include:  Past Medical History:  Diagnosis Date  . GERD (gastroesophageal reflux disease)     Past Surgical History:  Procedure Laterality Date  . ELBOW SURGERY Right     No Known Allergies  No family history on file.   Social History   Socioeconomic History  . Marital status: Single    Spouse name: Not on file  . Number of children: Not on file  . Years of education: Not on file  . Highest education level: Not on file  Occupational History  . Not on file  Social Needs  . Financial resource strain: Not on file  . Food insecurity    Worry: Not on file    Inability: Not on file  .  Transportation needs    Medical: Not on file    Non-medical: Not on file  Tobacco Use  . Smoking status: Never Smoker  . Smokeless tobacco: Never Used  Substance and Sexual Activity  . Alcohol use: No  . Drug use: No  . Sexual activity: Not on file  Lifestyle  . Physical activity    Days per week: Not on file    Minutes per session: Not on file  . Stress: Not on file  Relationships  . Social Musician on phone: Not on file    Gets together: Not on file    Attends religious service: Not on file    Active member of club or organization: Not on file    Attends meetings of clubs or organizations: Not on file    Relationship status: Not on file  . Intimate partner violence    Fear of current or ex partner: Not on file    Emotionally abused: Not on file    Physically abused: Not on file    Forced sexual activity: Not on file  Other Topics Concern  . Not on file  Social History Narrative   ** Merged History Encounter **         PHYSICAL EXAM:  VS: BP (!) 149/92   Pulse  69   Ht 5\' 11"  (1.803 m)   Wt 210 lb (95.3 kg)   BMI 29.29 kg/m  Physical Exam Gen: NAD, alert, cooperative with exam, well-appearing ENT: normal lips, normal nasal mucosa,  Eye: normal EOM, normal conjunctiva and lids CV:  no edema, +2 pedal pulses   Resp: no accessory muscle use, non-labored,   Skin: no rashes, no areas of induration  Neuro: normal tone, normal sensation to touch Psych:  normal insight, alert and oriented MSK:  Right foot:  Swelling occurring over the lateral midfoot. Tenderness to palpation at the base of the fifth metatarsal. No tenderness to palpation over the medial or lateral malleolus. Some tenderness to palpation over the navicular. Tenderness to palpation over the cuboid. Normal range of motion. Neurovascularly intact  Limited ultrasound: Right foot:  There is a fracture at the base of the fifth.  It seems either an avulsion versus a Jones fracture.   Peroneal brevis is intact and inserted. There also appears to be a change at the cuboid near the base of the fifth.  This seems to be a fracture that is minimally displaced and almost a chip fracture in nature. Normal-appearing peroneal tendons at the lateral malleolus.  Summary: Findings would suggest a base of the fifth metatarsal fracture.  Also possible for fracture of the cuboid.  Ultrasound and interpretation by Clearance Coots, MD    ASSESSMENT & PLAN:   Closed fracture of base of fifth metatarsal bone Injury occurred about 2 weeks ago.  More likely a Jones fracture as opposed to an avulsion fracture -Cam walker. -Counseled on supportive care. -X-ray. -Follow-up in 4 weeks.

## 2019-11-24 NOTE — Assessment & Plan Note (Signed)
Injury occurred about 2 weeks ago.  More likely a Jones fracture as opposed to an avulsion fracture -Cam walker. -Counseled on supportive care. -X-ray. -Follow-up in 4 weeks.

## 2019-11-24 NOTE — Patient Instructions (Signed)
Nice to meet you Please wear the CAM walker  Please use ice and elevate   Please send me a message in MyChart with any questions or updates.  Please see me back in 4 weeks.   --Dr. Raeford Razor

## 2019-11-25 ENCOUNTER — Telehealth: Payer: Self-pay | Admitting: Family Medicine

## 2019-11-25 NOTE — Telephone Encounter (Signed)
Informed of results.   Rosemarie Ax, MD [facility] 11/25/2019, 9:35 AM

## 2019-11-25 NOTE — Addendum Note (Signed)
Addended by: Rosemarie Ax on: 11/25/2019 08:43 AM   Modules accepted: Level of Service

## 2019-12-06 ENCOUNTER — Ambulatory Visit: Payer: PRIVATE HEALTH INSURANCE | Attending: Internal Medicine

## 2019-12-06 DIAGNOSIS — Z20822 Contact with and (suspected) exposure to covid-19: Secondary | ICD-10-CM

## 2019-12-07 LAB — NOVEL CORONAVIRUS, NAA: SARS-CoV-2, NAA: NOT DETECTED

## 2019-12-21 ENCOUNTER — Other Ambulatory Visit: Payer: Self-pay | Admitting: Family Medicine

## 2019-12-21 ENCOUNTER — Ambulatory Visit: Payer: PRIVATE HEALTH INSURANCE | Attending: Internal Medicine

## 2019-12-21 DIAGNOSIS — K219 Gastro-esophageal reflux disease without esophagitis: Secondary | ICD-10-CM

## 2019-12-21 DIAGNOSIS — Z20822 Contact with and (suspected) exposure to covid-19: Secondary | ICD-10-CM

## 2019-12-22 ENCOUNTER — Ambulatory Visit: Payer: PRIVATE HEALTH INSURANCE | Admitting: Family Medicine

## 2019-12-23 LAB — NOVEL CORONAVIRUS, NAA: SARS-CoV-2, NAA: NOT DETECTED

## 2020-01-15 ENCOUNTER — Other Ambulatory Visit: Payer: Self-pay | Admitting: Family Medicine

## 2020-01-15 DIAGNOSIS — K219 Gastro-esophageal reflux disease without esophagitis: Secondary | ICD-10-CM

## 2020-01-19 ENCOUNTER — Other Ambulatory Visit: Payer: Self-pay | Admitting: Family Medicine

## 2020-01-19 DIAGNOSIS — F902 Attention-deficit hyperactivity disorder, combined type: Secondary | ICD-10-CM

## 2020-01-20 ENCOUNTER — Encounter: Payer: Self-pay | Admitting: Family Medicine

## 2020-01-24 ENCOUNTER — Other Ambulatory Visit: Payer: Self-pay | Admitting: Family Medicine

## 2020-01-24 DIAGNOSIS — F902 Attention-deficit hyperactivity disorder, combined type: Secondary | ICD-10-CM

## 2020-01-24 MED ORDER — AMPHETAMINE-DEXTROAMPHETAMINE 20 MG PO TABS: 20.0000 mg | ORAL_TABLET | Freq: Every day | ORAL | 0 refills | Status: DC

## 2020-02-10 ENCOUNTER — Other Ambulatory Visit: Payer: Self-pay | Admitting: Family Medicine

## 2020-02-10 DIAGNOSIS — K219 Gastro-esophageal reflux disease without esophagitis: Secondary | ICD-10-CM

## 2020-02-15 ENCOUNTER — Ambulatory Visit: Payer: PRIVATE HEALTH INSURANCE | Admitting: Family Medicine

## 2020-02-28 ENCOUNTER — Ambulatory Visit (INDEPENDENT_AMBULATORY_CARE_PROVIDER_SITE_OTHER): Payer: Self-pay | Admitting: Family Medicine

## 2020-02-28 ENCOUNTER — Other Ambulatory Visit: Payer: Self-pay

## 2020-02-28 ENCOUNTER — Encounter: Payer: Self-pay | Admitting: Family Medicine

## 2020-02-28 VITALS — Ht 71.0 in | Wt 205.0 lb

## 2020-02-28 DIAGNOSIS — F902 Attention-deficit hyperactivity disorder, combined type: Secondary | ICD-10-CM

## 2020-02-28 DIAGNOSIS — K219 Gastro-esophageal reflux disease without esophagitis: Secondary | ICD-10-CM

## 2020-02-28 MED ORDER — AMPHETAMINE-DEXTROAMPHETAMINE 20 MG PO TABS: 20.0000 mg | ORAL_TABLET | Freq: Every day | ORAL | 0 refills | Status: DC

## 2020-02-28 MED ORDER — PANTOPRAZOLE SODIUM 40 MG PO TBEC: 40.0000 mg | DELAYED_RELEASE_TABLET | Freq: Every day | ORAL | 2 refills | Status: DC

## 2020-02-28 NOTE — Progress Notes (Signed)
Chief Complaint  Patient presents with  . Follow-up    refill adderall    Isaac Bailey is 29 y.o. male here for ADHD follow up. Due to COVID-19 pandemic, we are interacting via web portal for an electronic face-to-face visit. I verified patient's ID using 2 identifiers. Patient agreed to proceed with visit via this method. Patient is at home, I am at office. Patient and I are present for visit.   Patient is currently on Adderall 10-20 mg/d and compliance is excellent. Symptoms include inattention. Side effects include insomnia, decreased appetite (has not lost wt). Patient believes their dose should be unchanged. Denies tics, weight loss, self-medication, alcohol/drug abuse, chest pain, or palpitations.  GERD Taking Protonix 40 mg/d. Compliant. Stopped and his reflux s/s's came back. Tried to transition to Pepcid without sig relief. No wt loss no vomiting.   ROS:  Heart- denies chest pain or palpitations Psych- as noted in HPI  Past Medical History:  Diagnosis Date  . GERD (gastroesophageal reflux disease)    Allergies as of 02/28/2020   No Known Allergies     Medication List       Accurate as of February 28, 2020  3:11 PM. If you have any questions, ask your nurse or doctor.        amphetamine-dextroamphetamine 20 MG tablet Commonly known as: Adderall Take 1 tablet (20 mg total) by mouth daily. What changed: Another medication with the same name was added. Make sure you understand how and when to take each. Changed by: Sharlene Dory, DO   amphetamine-dextroamphetamine 20 MG tablet Commonly known as: Adderall Take 1 tablet (20 mg total) by mouth daily. Start taking on: March 29, 2020 What changed: You were already taking a medication with the same name, and this prescription was added. Make sure you understand how and when to take each. Changed by: Sharlene Dory, DO   amphetamine-dextroamphetamine 20 MG tablet Commonly known as: Adderall Take 1  tablet (20 mg total) by mouth daily. Start taking on: Apr 28, 2020 What changed: You were already taking a medication with the same name, and this prescription was added. Make sure you understand how and when to take each. Changed by: Sharlene Dory, DO   pantoprazole 40 MG tablet Commonly known as: PROTONIX Take 1 tablet (40 mg total) by mouth daily. What changed: additional instructions Changed by: Sharlene Dory, DO       Ht 5\' 11"  (1.803 m)   Wt 205 lb (93 kg)   BMI 28.59 kg/m  No conversational dyspnea Age appropriate judgment and insight Nml affect and mood  Attention deficit hyperactivity disorder (ADHD), combined type - Plan: amphetamine-dextroamphetamine (ADDERALL) 20 MG tablet  Gastroesophageal reflux disease without esophagitis - Plan: pantoprazole (PROTONIX) 40 MG tablet  Cont Adderall.  Cont PPI.  F/u in 6 mo for CPE. Pt voiced understanding and agreement to the plan.  [place], DO 02/28/20 3:11 PM

## 2020-03-07 ENCOUNTER — Other Ambulatory Visit: Payer: Self-pay | Admitting: Family Medicine

## 2020-03-07 DIAGNOSIS — K219 Gastro-esophageal reflux disease without esophagitis: Secondary | ICD-10-CM

## 2020-04-17 ENCOUNTER — Other Ambulatory Visit: Payer: Self-pay

## 2020-04-17 DIAGNOSIS — F902 Attention-deficit hyperactivity disorder, combined type: Secondary | ICD-10-CM

## 2020-04-17 NOTE — Telephone Encounter (Signed)
He should have some at the pharmacy. Ty.

## 2020-04-17 NOTE — Telephone Encounter (Signed)
Called the patient informed to check with pharmacy as they still have refill for him left. He verbalized understanding.

## 2020-08-24 ENCOUNTER — Telehealth: Payer: Self-pay

## 2020-08-24 NOTE — Telephone Encounter (Signed)
Pt's plan does not cover pantoprazole, he must first try OTC PPI (OTC Nexium, OTC Prilosec, OTC Prevacid).

## 2020-08-25 NOTE — Telephone Encounter (Signed)
Called the patient informed of PCP instructions. He did try to fill recently/unable due to plan change. He did state only does use as needed and will try one of the OTC recommended by PCP.

## 2020-08-25 NOTE — Telephone Encounter (Signed)
Called the patient no answer/mailbox was full. 

## 2020-08-25 NOTE — Telephone Encounter (Signed)
I sent that in almost 6 mo ago. Hopefully he doesn't need, but let him know in case he does use it as needed. Ty.

## 2021-01-31 ENCOUNTER — Telehealth: Payer: Self-pay | Admitting: *Deleted

## 2021-01-31 NOTE — Telephone Encounter (Signed)
Left message on machine to call back  Trying to see if we can schedule follow up appointment (ADHD).  Was suppose to follow up 08/2020

## 2021-08-06 ENCOUNTER — Encounter: Payer: Self-pay | Admitting: Family Medicine

## 2021-08-06 ENCOUNTER — Other Ambulatory Visit: Payer: Self-pay

## 2021-08-06 ENCOUNTER — Telehealth (INDEPENDENT_AMBULATORY_CARE_PROVIDER_SITE_OTHER): Payer: Self-pay | Admitting: Family Medicine

## 2021-08-06 DIAGNOSIS — F902 Attention-deficit hyperactivity disorder, combined type: Secondary | ICD-10-CM

## 2021-08-06 DIAGNOSIS — K219 Gastro-esophageal reflux disease without esophagitis: Secondary | ICD-10-CM

## 2021-08-06 MED ORDER — AMPHETAMINE-DEXTROAMPHET ER 20 MG PO CP24: 20.0000 mg | ORAL_CAPSULE | Freq: Every day | ORAL | 0 refills | Status: DC

## 2021-08-06 MED ORDER — PANTOPRAZOLE SODIUM 40 MG PO TBEC: 40.0000 mg | DELAYED_RELEASE_TABLET | Freq: Every day | ORAL | 2 refills | Status: AC

## 2021-08-06 NOTE — Progress Notes (Signed)
Chief Complaint  Patient presents with   Gastroesophageal Reflux    Isaac Bailey is 30 y.o. male here for ADHD follow up. Due to COVID-19 pandemic, we are interacting via web portal for an electronic face-to-face visit. I verified patient's ID using 2 identifiers. Patient agreed to proceed with visit via this method. Patient is at home, I am at office. Patient and I are present for visit.   Patient is currently on Adderall 20 mg/d and compliance is excellent. Symptoms include inattention. Side effects include: none. Patient believes their dose should be changed to a longer lasting version. Denies tics, weight loss, difficulties with sleep, self-medication, alcohol/drug abuse, chest pain, or palpitations.  GERD Several yr hx of GERD. Was taking Protonix, stopped due to things he read online about it. Has been using Pepcid. Nighttime s/s's and s/s's after meals. Never had EGD. No N/V, bleeding, wt loss.   Past Medical History:  Diagnosis Date   GERD (gastroesophageal reflux disease)    Objective No conversational dyspnea Age appropriate judgment and insight Nml affect and mood  Attention deficit hyperactivity disorder (ADHD), combined type - Plan: amphetamine-dextroamphetamine (ADDERALL XR) 20 MG 24 hr capsule  Gastroesophageal reflux disease without esophagitis - Plan: pantoprazole (PROTONIX) 40 MG tablet  Chronic, unstable. Change Adderall to XR version 20 mg/d. F.u in 1 mo.  Chronic, unstable. Go back on Protonix. OK to use Pepcid prn. Reflux precautions discussed. Pt voiced understanding and agreement to the plan.  Jilda Roche [place], DO 08/06/21 8:11 AM

## 2021-08-08 ENCOUNTER — Other Ambulatory Visit: Payer: Self-pay | Admitting: Family Medicine

## 2021-08-08 ENCOUNTER — Other Ambulatory Visit: Payer: Self-pay

## 2021-08-08 ENCOUNTER — Telehealth: Payer: Self-pay | Admitting: Family Medicine

## 2021-08-09 ENCOUNTER — Other Ambulatory Visit: Payer: Self-pay

## 2021-08-09 DIAGNOSIS — F902 Attention-deficit hyperactivity disorder, combined type: Secondary | ICD-10-CM

## 2021-08-10 ENCOUNTER — Other Ambulatory Visit: Payer: Self-pay | Admitting: Family Medicine

## 2021-08-10 ENCOUNTER — Telehealth: Payer: Self-pay | Admitting: Family Medicine

## 2021-08-10 DIAGNOSIS — F902 Attention-deficit hyperactivity disorder, combined type: Secondary | ICD-10-CM

## 2021-08-10 MED ORDER — AMPHETAMINE-DEXTROAMPHET ER 20 MG PO CP24: 20.0000 mg | ORAL_CAPSULE | Freq: Every day | ORAL | 0 refills | Status: DC

## 2021-08-10 NOTE — Telephone Encounter (Signed)
error 

## 2021-08-13 ENCOUNTER — Telehealth: Payer: Self-pay | Admitting: Family Medicine

## 2021-09-24 ENCOUNTER — Other Ambulatory Visit: Payer: Self-pay

## 2021-09-24 DIAGNOSIS — F902 Attention-deficit hyperactivity disorder, combined type: Secondary | ICD-10-CM

## 2021-09-25 NOTE — Telephone Encounter (Signed)
Last OV--08/06/21 Last RF--#30 no refills on 08/10/2021 No UDS/CSC

## 2021-09-26 ENCOUNTER — Other Ambulatory Visit: Payer: Self-pay

## 2021-09-26 DIAGNOSIS — F902 Attention-deficit hyperactivity disorder, combined type: Secondary | ICD-10-CM

## 2021-09-26 MED ORDER — AMPHETAMINE-DEXTROAMPHET ER 20 MG PO CP24: 20.0000 mg | ORAL_CAPSULE | Freq: Every day | ORAL | 0 refills | Status: DC

## 2021-09-26 NOTE — Telephone Encounter (Signed)
Last OV--08/06/21 Last Rf--08/10/21-#30 No UDS/CSC

## 2021-09-27 NOTE — Telephone Encounter (Signed)
He was due for f/u in a month, over 1 mo ago. That's why we only sent in 30 d. Ty.

## 2021-09-27 NOTE — Telephone Encounter (Signed)
Pt is scheduled on 10/01/21.

## 2021-10-01 ENCOUNTER — Other Ambulatory Visit: Payer: Self-pay

## 2021-10-01 ENCOUNTER — Telehealth: Payer: BC Managed Care – PPO | Admitting: Family Medicine

## 2021-12-03 ENCOUNTER — Other Ambulatory Visit: Payer: Self-pay | Admitting: Family Medicine

## 2021-12-03 DIAGNOSIS — F902 Attention-deficit hyperactivity disorder, combined type: Secondary | ICD-10-CM

## 2021-12-04 ENCOUNTER — Other Ambulatory Visit: Payer: Self-pay | Admitting: Family Medicine

## 2021-12-04 DIAGNOSIS — F902 Attention-deficit hyperactivity disorder, combined type: Secondary | ICD-10-CM

## 2021-12-04 MED ORDER — AMPHETAMINE-DEXTROAMPHET ER 20 MG PO CP24: 20.0000 mg | ORAL_CAPSULE | Freq: Every day | ORAL | 0 refills | Status: AC

## 2021-12-04 MED ORDER — AMPHETAMINE-DEXTROAMPHET ER 20 MG PO CP24: 20.0000 mg | ORAL_CAPSULE | ORAL | 0 refills | Status: AC

## 2021-12-04 MED ORDER — AMPHETAMINE-DEXTROAMPHET ER 20 MG PO CP24: 20.0000 mg | ORAL_CAPSULE | ORAL | 0 refills | Status: DC

## 2021-12-04 NOTE — Telephone Encounter (Signed)
Please make sure he is scheduled in Feb. He is due for inperson visit to update CSC and UDS. Can be CPE if he prefers. Ty.

## 2021-12-04 NOTE — Telephone Encounter (Signed)
Last OV--08/06/21 Last RF--#30 no refills on 09/26/21

## 2021-12-11 ENCOUNTER — Emergency Department (HOSPITAL_BASED_OUTPATIENT_CLINIC_OR_DEPARTMENT_OTHER): Payer: BC Managed Care – PPO

## 2021-12-11 ENCOUNTER — Emergency Department (HOSPITAL_BASED_OUTPATIENT_CLINIC_OR_DEPARTMENT_OTHER)
Admission: EM | Admit: 2021-12-11 | Discharge: 2021-12-11 | Disposition: A | Discharge status: Home / Self Care | Payer: BC Managed Care – PPO | Attending: Emergency Medicine | Admitting: Emergency Medicine

## 2021-12-11 ENCOUNTER — Encounter (HOSPITAL_BASED_OUTPATIENT_CLINIC_OR_DEPARTMENT_OTHER): Payer: Self-pay | Admitting: *Deleted

## 2021-12-11 ENCOUNTER — Other Ambulatory Visit: Payer: Self-pay

## 2021-12-11 DIAGNOSIS — M79601 Pain in right arm: Secondary | ICD-10-CM | POA: Diagnosis not present

## 2021-12-11 DIAGNOSIS — R072 Precordial pain: Secondary | ICD-10-CM | POA: Insufficient documentation

## 2021-12-11 DIAGNOSIS — S8991XA Unspecified injury of right lower leg, initial encounter: Secondary | ICD-10-CM | POA: Diagnosis present

## 2021-12-11 DIAGNOSIS — S80211A Abrasion, right knee, initial encounter: Secondary | ICD-10-CM | POA: Diagnosis not present

## 2021-12-11 DIAGNOSIS — S80212A Abrasion, left knee, initial encounter: Secondary | ICD-10-CM | POA: Insufficient documentation

## 2021-12-11 DIAGNOSIS — T1490XA Injury, unspecified, initial encounter: Secondary | ICD-10-CM

## 2021-12-11 DIAGNOSIS — R0789 Other chest pain: Secondary | ICD-10-CM
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
## 2021-12-11 NOTE — Discharge Instructions (Addendum)
Your x-rays are negative today.  Please follow-up with your orthopedic provider if you see fit.
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
## 2021-12-12 ENCOUNTER — Encounter: Payer: Self-pay | Admitting: Family Medicine

## 2021-12-12 ENCOUNTER — Telehealth (INDEPENDENT_AMBULATORY_CARE_PROVIDER_SITE_OTHER): Payer: BC Managed Care – PPO | Admitting: Family Medicine

## 2021-12-12 DIAGNOSIS — Z91199 Patient's noncompliance with other medical treatment and regimen due to unspecified reason: Secondary | ICD-10-CM

## 2021-12-12 NOTE — Progress Notes (Signed)
Several attempts to reach the patient were unsuccessful.

## 2021-12-19 ENCOUNTER — Ambulatory Visit: Payer: BC Managed Care – PPO | Admitting: Family Medicine

## 2021-12-19 ENCOUNTER — Encounter: Payer: Self-pay | Admitting: Family Medicine

## 2022-03-13 ENCOUNTER — Other Ambulatory Visit: Payer: Self-pay | Admitting: Family Medicine

## 2022-03-14 MED ORDER — AMPHETAMINE-DEXTROAMPHET ER 20 MG PO CP24: 20.0000 mg | ORAL_CAPSULE | ORAL | 0 refills | Status: AC

## 2022-03-14 NOTE — Telephone Encounter (Signed)
He really needs an in person appt. I can't keep rx'ing this from afar with our office policy.  ?

## 2022-03-14 NOTE — Telephone Encounter (Signed)
Called the patient and he was very willing to schedule an appointment. ?Scheduled on 03/20/22. ?

## 2022-03-14 NOTE — Telephone Encounter (Signed)
Last OV--08/06/21 ?Last RF--12/24/2021 ?

## 2022-03-20 ENCOUNTER — Ambulatory Visit: Payer: BLUE CROSS/BLUE SHIELD | Admitting: Family Medicine

## 2022-03-20 ENCOUNTER — Encounter: Payer: Self-pay | Admitting: Family Medicine

## 2022-06-15 DEATH — deceased

## 2023-03-31 ENCOUNTER — Encounter: Payer: Self-pay | Admitting: *Deleted
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
